# Patient Record
Sex: Male | Born: 2005 | Race: Black or African American | Hispanic: No | Marital: Single | Smoking: Current some day smoker
Health system: Southern US, Community
[De-identification: ages and names within clinical notes are randomized; demographics above are authoritative.]

## PROBLEM LIST (undated history)

## (undated) DIAGNOSIS — J45909 Unspecified asthma, uncomplicated: Secondary | ICD-10-CM

---
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
## 2023-11-11 NOTE — BH Treatment Plan (Signed)
 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  risperiDONE, 2 mg, oral, At Bedtime   Family Meeting:  collateral  Precautions: safety precautions    Treatment Plan:  medication management  Current Discharge Plan:  in progress  Expected Discharge Date:  TBD  Reason(s) patient continues to need hospitalization:  Medication Stabilization  The patient and/or caregiver continues to be involved in their plan of care and there is mutual agreement on the self-management plan.  The patient specific goals and risk factors from above were discussed with the patient and /or caregiver from a multidisciplinary approach.  All team members listed as present above agree with and sign off on the patient specific goals.        [1] Patient Active Problem List Diagnosis  . Elevated CK  . Mania    (CMD)  . Elevated LFTs  . Unspecified bipolar and related disorder  [2] Past Medical History: Diagnosis Date  . HL (hearing loss)

## 2023-11-12 NOTE — Progress Notes (Signed)
 Patient participated in psychoeducational process where we talked about the use/abuse/addiction process of addiction. We also talked about the neurobiological processes involved in Substance Use Disorder. We also talked about the connection between psychiatric and substance use disorders, and the importance of seeking treatment for both in order to develop healthy ways of living: spiritual, mental, social, emotional, and physical. Patients were encouraged to talk with their treatment team as a way of seeking additional assistance upon discharge related to any possible SUD issues.

## 2023-11-12 NOTE — Nursing Note (Signed)
 AVS reviewed with patient.  Verbalized understanding.  Encouraged to keep all follow-up appointments.  Medications were called into [facility] in [place] per sister's request.   Acknowledged receiving all belongings brought to the hospital.  Escorted to the front of [facility] where his friend Zyon was waiting.

## 2023-11-12 NOTE — Progress Notes (Signed)
 Case Management Discharge Note        CSN: [id number] DOB: 2005-02-15 Service: Psychiatry Location: S110/A  Patient Class: Inpatient Psych  DC Disposition: : Home or Self Care  Discharge DC Disposition: : Home or Self Care Discharge Transport Agency Chosen: Other (comment) (friend)  Discharge Referrals Services Provided: Social Worker Case closed, patient/family agree with disposition plan: Yes  Patient picked up by friend and discharged back to the apartment he shares with his sister. F/U was scheduled.  Transition record faxed.      Case Management Coordination Status: Coordination Complete     Harlene People, LCSWA

## 2023-11-25 ENCOUNTER — Emergency Department (HOSPITAL_COMMUNITY)
Admission: EM | Admit: 2023-11-25 | Discharge: 2023-11-26 | Disposition: A | Discharge status: Other Acute Inpatient Hospital | Attending: Emergency Medicine | Admitting: Emergency Medicine

## 2023-11-25 ENCOUNTER — Other Ambulatory Visit: Payer: Self-pay

## 2023-11-25 ENCOUNTER — Encounter (HOSPITAL_COMMUNITY): Payer: Self-pay

## 2023-11-25 DIAGNOSIS — J45909 Unspecified asthma, uncomplicated: Secondary | ICD-10-CM | POA: Insufficient documentation

## 2023-11-25 DIAGNOSIS — F1994 Other psychoactive substance use, unspecified with psychoactive substance-induced mood disorder: Secondary | ICD-10-CM

## 2023-11-25 DIAGNOSIS — F19959 Other psychoactive substance use, unspecified with psychoactive substance-induced psychotic disorder, unspecified: Secondary | ICD-10-CM

## 2023-11-25 DIAGNOSIS — F17290 Nicotine dependence, other tobacco product, uncomplicated: Secondary | ICD-10-CM | POA: Insufficient documentation

## 2023-11-25 DIAGNOSIS — F29 Unspecified psychosis not due to a substance or known physiological condition: Secondary | ICD-10-CM | POA: Diagnosis not present

## 2023-11-25 LAB — CBC WITH DIFFERENTIAL/PLATELET
Abs Immature Granulocytes: 0.01 K/uL (ref 0.00–0.07)
Basophils Absolute: 0 K/uL (ref 0.0–0.1)
Basophils Relative: 1 %
Eosinophils Absolute: 0.2 K/uL (ref 0.0–0.5)
Eosinophils Relative: 4 %
HCT: 50.1 % (ref 39.0–52.0)
Hemoglobin: 16.4 g/dL (ref 13.0–17.0)
Immature Granulocytes: 0 %
Lymphocytes Relative: 49 %
Lymphs Abs: 2.1 K/uL (ref 0.7–4.0)
MCH: 30.9 pg (ref 26.0–34.0)
MCHC: 32.7 g/dL (ref 30.0–36.0)
MCV: 94.4 fL (ref 80.0–100.0)
Monocytes Absolute: 0.5 K/uL (ref 0.1–1.0)
Monocytes Relative: 11 %
Neutro Abs: 1.5 K/uL — ABNORMAL LOW (ref 1.7–7.7)
Neutrophils Relative %: 35 %
Platelets: 228 K/uL (ref 150–400)
RBC: 5.31 MIL/uL (ref 4.22–5.81)
RDW: 11.7 % (ref 11.5–15.5)
WBC: 4.3 K/uL (ref 4.0–10.5)
nRBC: 0 % (ref 0.0–0.2)

## 2023-11-25 LAB — URINE DRUG SCREEN
Amphetamines: NEGATIVE
Barbiturates: NEGATIVE
Benzodiazepines: NEGATIVE
Cocaine: NEGATIVE
Fentanyl: NEGATIVE
Methadone Scn, Ur: NEGATIVE
Opiates: NEGATIVE
Tetrahydrocannabinol: POSITIVE — AB

## 2023-11-25 LAB — COMPREHENSIVE METABOLIC PANEL WITH GFR
ALT: 30 U/L (ref 0–44)
AST: 50 U/L — ABNORMAL HIGH (ref 15–41)
Albumin: 4.8 g/dL (ref 3.5–5.0)
Alkaline Phosphatase: 66 U/L (ref 38–126)
Anion gap: 11 (ref 5–15)
BUN: 14 mg/dL (ref 6–20)
CO2: 25 mmol/L (ref 22–32)
Calcium: 10 mg/dL (ref 8.9–10.3)
Chloride: 103 mmol/L (ref 98–111)
Creatinine, Ser: 1.08 mg/dL (ref 0.61–1.24)
GFR, Estimated: >60 mL/min (ref >60)
Glucose, Bld: 93 mg/dL (ref 70–99)
Potassium: 4.2 mmol/L (ref 3.5–5.1)
Sodium: 139 mmol/L (ref 135–145)
Total Bilirubin: 1.2 mg/dL (ref 0.0–1.2)
Total Protein: 8.2 g/dL — ABNORMAL HIGH (ref 6.5–8.1)

## 2023-11-25 MED ORDER — MELATONIN 3 MG PO TABS: 6.0000 mg | ORAL_TABLET | Freq: Every evening | ORAL | Status: DC | PRN

## 2023-11-25 MED ORDER — HYDROXYZINE HCL 25 MG PO TABS: 25.0000 mg | ORAL_TABLET | Freq: Three times a day (TID) | ORAL | Status: DC | PRN

## 2023-11-25 MED ORDER — RISPERIDONE 0.5 MG PO TBDP
2.0000 mg | ORAL_TABLET | Freq: Every day | ORAL | Status: DC
  Administered 2023-11-25: 2 mg via ORAL
  Filled 2023-11-25: qty 4

## 2023-11-25 MED ORDER — ALUM & MAG HYDROXIDE-SIMETH 200-200-20 MG/5ML PO SUSP: 30.0000 mL | Freq: Four times a day (QID) | ORAL | Status: DC | PRN

## 2023-11-25 MED ORDER — ACETAMINOPHEN 325 MG PO TABS: 650.0000 mg | ORAL_TABLET | ORAL | Status: DC | PRN

## 2023-11-25 MED ORDER — OLANZAPINE 5 MG PO TBDP
5.0000 mg | ORAL_TABLET | Freq: Once | ORAL | Status: DC
  Filled 2023-11-25: qty 1

## 2023-11-25 NOTE — ED Notes (Addendum)
 Pt has 2 belonging bags, placed in locker 28

## 2023-11-25 NOTE — Consult Note (Signed)
 [facility] Psychiatric Consult Initial  Patient Name: .Joe Moore  MRN: [id number]  DOB: 2005/06/27  Consult Order details:  Orders (From admission, onward)     Start     Ordered   11/25/23 1251  CONSULT TO CALL ACT TEAM       Ordering Provider: Elnor Jayson LABOR, DO  Provider:  (Not yet assigned)  Question:  Reason for Consult?  Answer:  Psych consult   11/25/23 1250             Mode of Visit: In person    Psychiatry Consult Evaluation  Service Date: November 25, 2023 LOS:  LOS: 0 days  Chief Complaint "They brought me here because of my mother. 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 male admitted: Presented to the EDfor 11/25/2023 10:31 AM for Bizarre behavior, paranoia, substance use; IVC initiated. He carries the psychiatric diagnoses of possible bipolar and has a past medical history of periorbital cellulitis, acute rhabdo myelosis, sinusitis.   18 year old male with recent substance use (mushrooms/THC), prior psychiatric hospitalization, medication noncompliance, trauma history, and strong family history of psychiatric disorders presents with paranoia, bizarre behavior, impaired judgment, and poor insight. Although minimizing symptoms, collateral confirms psychosis-like episodes, erratic behavior, safety risk, and lack of insight with repeated decompensation.  Patient's statement that "being here makes me want to hurt myself," combined with recent wandering behavior, disorganized thinking, and substance-related psychosis risk, meets criteria for continued inpatient psychiatric treatment. He is not psychiatrically safe for discharge. Please see plan below for detailed recommendations.   Diagnoses:  Active Hospital problems: Principal Problem:   Substance induced mood disorder (HCC) Active  Problems:   Psychosis (HCC)    Plan   ## Psychiatric Medication Recommendations:  Restart patient back on Risperdal 2 mg at bedtime for mood Melatonin 6 mg as needed at bedtime for insomnia Atarax 25 mg p.o. 3 times daily as needed for anxiety  ## Medical Decision Making Capacity: Not specifically addressed in this encounter  ## Further Work-up:  -- No further workup needed at this time EKG or UDS -- most recent EKG on 11/25/2023 had QtC of 388 -- Pertinent labwork reviewed earlier this admission includes: CBC, CMP, EKG, UDS   ## Disposition:-- We recommend inpatient psychiatric hospitalization after medical hospitalization. Patient has been involuntarily committed on 11/25/2023.   ## Behavioral / Environmental: -Difficult Patient (SELECT OPTIONS FROM BELOW), To minimize splitting of staff, assign one staff person to communicate all information from the team when feasible., or Utilize compassion and acknowledge the patient's experiences while setting clear and realistic expectations for care.    ## Safety and Observation Level:  - Based on my clinical evaluation, I estimate the patient to be at moderate risk of self harm in the current setting. - At this time, we recommend  1:1 Observation. This decision is based on my review of the chart including patient's history and current presentation, interview of the patient, mental status examination, and consideration of suicide risk including evaluating suicidal ideation, plan, intent, suicidal or self-harm behaviors, risk factors, and protective factors. This judgment is based on our ability to directly address suicide risk, implement suicide prevention strategies, and develop a safety plan while the patient is in the clinical setting. Please contact our team if there is a concern that risk level has changed.  CSSR Risk Category:C-SSRS RISK CATEGORY:  No Risk  Suicide Risk Assessment: Patient has following modifiable risk factors for suicide:  medication noncompliance and active mental illness (to encompass adhd, tbi, mania, psychosis, trauma reaction), which we are addressing by recommending inpatient psychiatric admission. Patient has following non-modifiable or demographic risk factors for suicide: male gender and psychiatric hospitalization Patient has the following protective factors against suicide: Access to outpatient mental health care and Supportive family  Thank you for this consult request. Recommendations have been communicated to the primary team.  We will continue to follow patient at this time.   [name], PMHNP       History of Present Illness  Relevant Aspects of Hospital ED Course:  Admitted on 11/25/2023 for Bizarre behavior, paranoia, substance use; IVC initiated.  Patient Report:  18 year old male with a history of polysubstance use and asthma presents under IVC after family reported concerns for paranoia, worsening depression, and bizarre behavior. Patient was brought in by police after being found in a different city, wandering in the street, and observed in fetal position on the floor of a gas station. Family reported suspected THC and hallucinogenic mushroom use.  During evaluation, patient is guarded, minimizing symptoms, and attempts to [facility] the provider using psychology terminology. 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 He denies SI, HI, hallucinations, or paranoia, though states "being here makes me want to hurt myself." He reports poor relationship with his mother but becomes evasive when asked to elaborate. He denies violence but states his mother was "physically collecting him," then refuses to expand on this. Denies current substance use except marijuana and claims mushrooms entered his system by only "touching a mushroom on campus." Denies psychotropic medications, despite recent documented use of risperidone.  Patient  initially denies paranoia but later states he thinks his mother is trying to kill him and "I'm just a 18 year old college student."  He denies current medical concerns.  Psych ROS:  Depression: Denies Anxiety: Endorses due to being here in the hospital Mania (lifetime and current): Denies Psychosis: (lifetime and current): Denies  Collateral information:  Collateral Information (Sister: Joe Moore; Mother present via 3-way call) Collateral reports significant decline and recurrent episodes of psychosis: Family confirms THC and mushroom use and found a bag of mushrooms in his room. Behavior became bizarre on Sunday: crying to professors at school, seeking $200 for a credit card bill, left home abruptly. Monday: took excessive showers, flooded sister's floors, began selective mutism, then drove away without notice. Family notes a pattern: during episodes he spirals, becomes paranoid, and sees his mother as the "enemy." Prior psychiatric hospitalization: [facility] 11/07/23: Initially hospitalized for rhabdomyolysis 10/20-10/23/25: Transferred to [facility]; discharged early after "talking his way out" per family Started on risperidone 2 mg, noncompliant since discharge Currently not sleeping or eating well  Significant trauma history: Brother killed in 2018 Maternal grandparents killed shortly after Never received grief counseling Prior depressive episode in 2024: isolated for 2 weeks in college dorm Attends therapy inconsistently Joe Moore, Joe Moore)  Family psychiatric history: Father - substance addiction Maternal great grandmother - schizophrenia Sister - bipolar disorder  Mother states family is supportive and he has stable housing, finances, and resources  Family strongly concerned for his safety and mental stability, noting significant behavioral decompensation with substance use.  Review of Systems  Psychiatric/Behavioral:         Thought  Process: Circumstantial, evasive, deflective  Thought Content: Minimizes symptoms; paranoid thoughts toward  mother; poor insight into recent behavior; denies SI/HI/AVH  Insight: Poor  Judgment: Impaired-recent disorganized behavior, substance use, inconsistent reporting     Psychiatric and Social History  Psychiatric History:  Information collected from patient, chart review, mother and sister  Prev Dx/Sx: Unspecified bipolar, substance-induced mood disorder Current Psych Provider: Denies Home Meds (current): Yes Previous Med Trials: Yes Therapy: Yes  Prior Psych Hospitalization: Yes Prior Self Harm: Denies Prior Violence: Denies  Family Psych History: Yes Family Hx suicide: Denies  Social History:  Developmental Hx: Deferred Educational Hx: Patient graduated high school Occupational Hx: Unemployed Armed Forces Operational Officer Hx: Denies Living Situation: Lives with his sister Spiritual Hx: Yes Access to weapons/lethal means: Denies   Substance History Alcohol: Yes Type of alcohol varies Last Drink unknown Number of drinks per day occasionally History of alcohol withdrawal seizures Denies History of DT's Denies Tobacco: Yes Illicit drugs: Yes Prescription drug abuse: Denies Rehab hx: Denies  Exam Findings  Physical Exam:  Vital Signs:  Temp:  [98.1 F (36.7 C)-98.3 F (36.8 C)] 98.1 F (36.7 C) (11/04 1501) Pulse Rate:  [65-70] 65 (11/04 1501) Resp:  [16-18] 16 (11/04 1501) BP: (109-122)/(82-85) 122/85 (11/04 1501) SpO2:  [100 %] 100 % (11/04 1501) Weight:  [81.6 kg] 81.6 kg (11/04 1034) Blood pressure 122/85, pulse 65, temperature 98.1 F (36.7 C), temperature source Oral, resp. rate 16, height 5' 11 (1.803 m), weight 81.6 kg, SpO2 100%. Body mass index is 25.1 kg/m.  Physical Exam Vitals and nursing note reviewed. Exam conducted with a chaperone present.  Constitutional:      Appearance: Normal appearance.  Neurological:     Mental Status: He is alert.  Psychiatric:         Attention and Perception: Attention normal.        Mood and Affect: Affect is flat.        Speech: Speech normal.        Behavior: Behavior is withdrawn.        Cognition and Memory: Memory normal.        Judgment: Judgment is inappropriate.     Comments: Thought Process: Circumstantial, evasive, deflective  Thought Content: Minimizes symptoms; paranoid thoughts toward mother; poor insight into recent behavior; denies SI/HI/AVH  Insight: Poor  Judgment: Impaired-recent disorganized behavior, substance use, inconsistent reporting     Mental Status Exam: General Appearance: Fairly Groomed  Orientation:  Full (Time, Place, and Person)  Memory:  Immediate;   Fair Recent;   Fair  Concentration:  Concentration: Fair  Recall:  Fair  Attention  Fair  Eye Contact:  Fair  Speech:  Clear and Coherent  Language:  Good  Volume:  Normal  Mood: "Anxious because I don't want to be here"  Affect:  Restricted, irritable  Thought Process:  Circumstantial, evasive, deflective  Thought Content:  Minimizes symptoms; paranoid thoughts toward mother; poor insight into recent behavior; denies SI/HI/AVH  Suicidal Thoughts:  No  Homicidal Thoughts:  No  Judgement:  Impaired--recent disorganized behavior, substance use, inconsistent reporting  Insight:  Shallow  Psychomotor Activity:  Normal  Akathisia:  No  Fund of Knowledge:  Fair      Assets:  Manufacturing Systems Engineer Desire for Improvement Financial Resources/Insurance Housing Social Support  Cognition:  Impaired,  Mild  ADL's:  Impaired  AIMS (if indicated):        Other History   These have been pulled in through the EMR, reviewed, and updated if appropriate.  Family History:  The patient's family history is not on  file.  Medical History: Past Medical History:  Diagnosis Date  . Asthma     Surgical History: History reviewed. No pertinent surgical history.   Medications:   Current Facility-Administered Medications:  .   acetaminophen  (TYLENOL ) tablet 650 mg, 650 mg, Oral, Q4H PRN, Elnor Savant A, DO .  alum & mag hydroxide-simeth (MAALOX/MYLANTA) 200-200-20 MG/5ML suspension 30 mL, 30 mL, Oral, Q6H PRN, Elnor Savant A, DO .  OLANZapine zydis (ZYPREXA) disintegrating tablet 5 mg, 5 mg, Oral, Once, Elnor Savant LABOR, DO  Current Outpatient Medications:  .  ibuprofen  (ADVIL ,MOTRIN ) 600 MG tablet, Take 1 tablet (600 mg total) by mouth every 8 (eight) hours as needed for mild pain or moderate pain., Disp: 30 tablet, Rfl: 0  Allergies: Allergies  Allergen Reactions  . Penicillins Other (See Comments), Nausea Only and Dermatitis    Other Reaction: mild rash on day 4  Other Reaction: mild rash on day 4  Other Reaction: mild rash on day 4    Joe Moore, PMHNP

## 2023-11-25 NOTE — ED Triage Notes (Signed)
 Pt to er via police, per pd pt was just released from [facility] for a week.  States that he thinks that his mom is trying to kill him.  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  Pd  states that mom is taking papers out for strange behavior.

## 2023-11-25 NOTE — Progress Notes (Signed)
 Inpatient Psychiatric Referral  Patient was recommended inpatient per Jadeka Motley-Mangrum, NP . There are no available beds at [facility], per [facility]. Patient was referred to the following out of network facilities:  Destination  Service Provider Address Phone Fax  [facility]  [address] [place] [address] [phone]  [facility]  [address], [place] [address] [phone]  [facility]  [address], [place] [address] [phone]  [facility] EFAX  [address] [place], [place] [phone]  [facility]  [address] [place], [place] [address] [phone]  [facility]  [address] [place], [place] [address] [phone]  [facility]  [address] [place] [address] [phone]  [facility]  [address] [place] [address] [phone]     Situation ongoing, CSW to continue following and update chart as more information becomes available.  Harrie Sofia MSW, LCSWA 11/25/2023

## 2023-11-25 NOTE — ED Notes (Signed)
 Pt verbally told me he does not want staff to GIVE OUT ANY HEALTH INFORMATION to his MOTHER.

## 2023-11-25 NOTE — ED Provider Notes (Signed)
 [place] EMERGENCY DEPARTMENT AT [facility] Provider Note  CSN: [id number] Arrival date & time: 11/25/23 1027  Chief Complaint(s) Psychiatric Evaluation  HPI Joe Moore is a 18 y.o. male with past medical history as below, significant for asthma, polysubstance abuse who presents to the ED with complaint of IVC  Patient arrived to ER with police department.  Concern for paranoia, worsening depression, bizarre behavior.  Family reported patient was using THC and hallucinogenic mushrooms.  Patient drove himself to a different city and was wandering in the street. In the fetal position on the floor of a gas station. Patient denies HI or SI, denies hallucinations. No medical complaints verbalized   Past Medical History Past Medical History:  Diagnosis Date   Asthma    There are no active problems to display for this patient.  Home Medication(s) Prior to Admission medications   Medication Sig Start Date End Date Taking? Authorizing Provider  ibuprofen  (ADVIL ,MOTRIN ) 600 MG tablet Take 1 tablet (600 mg total) by mouth every 8 (eight) hours as needed for mild pain or moderate pain. 06/06/16   Joe Mariel Boon, NP                                                                                                                                    Past Surgical History History reviewed. No pertinent surgical history. Family History History reviewed. No pertinent family history.  Social History Social History   Tobacco Use   Smoking status: Some Days    Types: Cigars   Smokeless tobacco: Never  Vaping Use   Vaping status: Never Used  Substance Use Topics   Alcohol use: Never   Drug use: Yes    Types: Marijuana   Allergies Penicillins  Review of Systems A thorough review of systems was obtained and all systems are negative except as noted in the HPI and PMH.   Physical Exam Vital Signs  I have reviewed the triage vital signs BP 109/82 (BP Location:  Left Arm)   Pulse 70   Temp 98.3 F (36.8 C) (Oral)   Resp 18   Ht 5' 11 (1.803 m)   Wt 81.6 kg   SpO2 100%   BMI 25.10 kg/m  Physical Exam Vitals and nursing note reviewed.  Constitutional:      General: He is not in acute distress.    Appearance: Normal appearance. He is well-developed. He is not ill-appearing.  HENT:     Head: Normocephalic and atraumatic.     Right Ear: External ear normal.     Left Ear: External ear normal.     Nose: Nose normal.     Mouth/Throat:     Mouth: Mucous membranes are moist.  Eyes:     General: No scleral icterus.       Right eye: No discharge.        Left eye: No discharge.  Cardiovascular:  Rate and Rhythm: Normal rate.  Pulmonary:     Effort: Pulmonary effort is normal. No respiratory distress.     Breath sounds: No stridor.  Abdominal:     General: Abdomen is flat. There is no distension.     Tenderness: There is no guarding.  Musculoskeletal:        General: No deformity.     Cervical back: No rigidity.  Skin:    General: Skin is warm and dry.     Coloration: Skin is not cyanotic, jaundiced or pale.  Neurological:     Mental Status: He is alert.  Psychiatric:        Mood and Affect: Mood is depressed.        Speech: Speech is rapid and pressured.        Behavior: Behavior is withdrawn and hyperactive. Behavior is cooperative.        Thought Content: Thought content is paranoid and delusional.     ED Results and Treatments Labs (all labs ordered are listed, but only abnormal results are displayed) Labs Reviewed  COMPREHENSIVE METABOLIC PANEL WITH GFR - Abnormal; Notable for the following components:      Result Value   Total Protein 8.2 (*)    AST 50 (*)    All other components within normal limits  CBC WITH DIFFERENTIAL/PLATELET - Abnormal; Notable for the following components:   Neutro Abs 1.5 (*)    All other components within normal limits  ETHANOL  URINE DRUG SCREEN                                                                                                                           Radiology No results found.  Pertinent labs & imaging results that were available during my care of the patient were reviewed by me and considered in my medical decision making (see MDM for details).  Medications Ordered in ED Medications  OLANZapine zydis (ZYPREXA) disintegrating tablet 5 mg (5 mg Oral Patient Refused/Not Given 11/25/23 1202)  acetaminophen  (TYLENOL ) tablet 650 mg (has no administration in time range)  alum & mag hydroxide-simeth (MAALOX/MYLANTA) 200-200-20 MG/5ML suspension 30 mL (has no administration in time range)                                                                                                                                     Procedures Procedures  (  including critical care time)  Medical Decision Making / ED Course    Medical Decision Making:    Joe Moore is a 18 y.o. male  with past medical history as below, significant for asthma, polysubstance abuse who presents to the ED with complaint of IVC. The complaint involves an extensive differential diagnosis and also carries with it a high risk of complications and morbidity.  Serious etiology was considered. Ddx includes but is not limited to: Substance induced psychiatric disorder, psychosis, intoxication, electrolyte derangement, infection, etc.  Complete initial physical exam performed, notably the patient was in acute distress, tearful.    Reviewed and confirmed nursing documentation for past medical history, family history, social history.  Vital signs reviewed.    Acute psychosis> - pt acutely psychotic - High suspicion for drug-induced psychosis patient has been using THC and hallucinogenic mushrooms.  Patient was found in the gas station in a different city, concern for paranoia that family members are trying to harm him.  He really refusing to speak - Patient has poor insight into his condition, has not  been caring for himself over the past few days, abusing illicit substances.  High risk of decompensation and self-harm.  Will place patient under IVC - Medically cleared at this time, pending TTS evaluation. Dispo per TTS - he was admitted at [facility] last month for similar                     Additional history obtained: -Additional history obtained from PD -External records from outside source obtained and reviewed including: Chart review including previous notes, labs, imaging, consultation notes including  Prior admission      Lab Tests: -I ordered, reviewed, and interpreted labs.   The pertinent results include:   Labs Reviewed  COMPREHENSIVE METABOLIC PANEL WITH GFR - Abnormal; Notable for the following components:      Result Value   Total Protein 8.2 (*)    AST 50 (*)    All other components within normal limits  CBC WITH DIFFERENTIAL/PLATELET - Abnormal; Notable for the following components:   Neutro Abs 1.5 (*)    All other components within normal limits  ETHANOL  URINE DRUG SCREEN    Notable for labs stable  EKG   EKG Interpretation Date/Time:  Tuesday November 25 2023 11:09:17 EST Ventricular Rate:  65 PR Interval:  176 QRS Duration:  88 QT Interval:  373 QTC Calculation: 388 R Axis:   64  Text Interpretation: Sinus rhythm Confirmed by Randol Simmonds [phone]) on 11/25/2023 11:15:41 AM         Imaging Studies ordered: na   Medicines ordered and prescription drug management: Meds ordered this encounter  Medications   OLANZapine zydis (ZYPREXA) disintegrating tablet 5 mg   acetaminophen  (TYLENOL ) tablet 650 mg   alum & mag hydroxide-simeth (MAALOX/MYLANTA) 200-200-20 MG/5ML suspension 30 mL    -I have reviewed the patients home medicines and have made adjustments as needed   Consultations Obtained: I requested consultation with the TTS   Cardiac Monitoring: Continuous pulse oximetry interpreted by myself, 100% on RA.    Social  Determinants of Health:  Diagnosis or treatment significantly limited by social determinants of health: current smoker and polysubstance abuse, uninsured   Reevaluation: After the interventions noted above, I reevaluated the patient and found that they have stayed the same  Co morbidities that complicate the patient evaluation  Past Medical History:  Diagnosis Date   Asthma  Dispostion: Disposition decision including need for hospitalization was considered, and patient disposition pending at time of sign out.    Final Clinical Impression(s) / ED Diagnoses Final diagnoses:  Psychosis, unspecified psychosis type (HCC)        Elnor Jayson LABOR, DO 11/25/23 1301

## 2023-11-25 NOTE — Progress Notes (Signed)
 Pt has been accepted to [facility] . Unit assignment: [facility]   Pt meets inpatient criteria per [name], NP   Attending Physician will be Dr. Arnold  Report can be called to: [phone]  Pt can arrive after ASAP 8AM  Care Team Notified: Ester Sloop, RN,  [name], NP

## 2023-11-25 NOTE — ED Notes (Signed)
 Pt has been calm and cooperative with staff, no aggressive behavior noted.

## 2023-11-25 NOTE — ED Notes (Addendum)
 Pt purposely broke one of his wristbands off to take them off upon staff request. Security officer witnessed, placed into locker 28

## 2023-11-26 NOTE — ED Notes (Signed)
  Pt has been accepted to [facility] . Unit assignment: [facility]    Pt meets inpatient criteria per [name], NP    Attending Physician will be Dr. Arnold   Report can be called to: [phone]   Pt can arrive after ASAP 8AM   Care Team Notified: Ester Sloop, RN,  [name], NP         Electronically signed by Fate Mix, LCSW at 11/25/2023  7:10 PM

## 2023-11-26 NOTE — Discharge Instructions (Addendum)
 Transfer to old [facility].  Dr.Iqbal is accepting

## 2023-11-26 NOTE — ED Notes (Signed)
 Patient discharged off the unit to facility per provider. Patient alert, calm, cooperative, no s/s of distress at discharge. Discharge information and belongings given to [facility] for transport. Patient ambulatory off unit, escorted and transported by [facility].

## 2023-11-26 NOTE — ED Notes (Addendum)
 [facility] called pts sister, [place] to inform her that pt has been accepted to [facility] and will be transferred there today. [facility] left a HIPAA compliant message to return the call.   Pts sister returned the call to [facility]. [facility] informed pts sister that pt will be transferred today to [facility] provided contact information for [facility]. Pts sister was appreciative of the information.   Chesley Holt, [facility]   11/26/23

## 2023-12-05 NOTE — Progress Notes (Signed)
 [address] - AMBULATORY [facility]  - FAMILY MEDICINE [facility] [address] [place] [address]    Date of Service: 12/05/2023 Patient Name: Joe Moore Patient DOB: [birth year]    Assessment and Plan:  Joe Moore was seen today for hand injury.  Diagnoses and all orders for this visit:  Right hand pain Recommend him getting x-ray of his hand today and if positive will send to orthopedics otherwise still recommend him wearing a brace until knows the plan. -     XR Hand Minimum 3 Views Right; Future    He verbalizes understanding and agreement of his current diagnoses, medications and therapies. All questions answered.   Medication side effects discussed with patient. Advised patient to call clinic or return for visit if these symptoms occur. The patient does not  have any barriers to taking medications as prescribed. Goals of care discussed with patient including medication adherence and adequate follow up Relevant barriers identified and addressed: none.   Results for orders placed or performed during the hospital encounter of 11/10/23  Hemoglobin A1C With Estimated Average Glucose   Collection Time: 11/11/23  7:11 AM  Result Value Ref Range   Hemoglobin A1c 5.3 <5.7 %   Estimated Average Glucose 105 mg/dL   Return if symptoms worsen or fail to improve. Electronically signed by: Elsie Fairy Favorite, PA-C 12/05/2023 10:54 AM   HPI:   Joe Moore is a 18 y.o. male presents with Hand Injury (Pt punched the wall yesterday,  he was seen at [facility] and a nurse told him he had a fracture.  He was supposed to get an xray last night but did not  )    ROS:  Constitutional symptoms: negative Eyes:  negative Ear, nose, throat:  negative Cardiovascular:  negative Respiratory:  negative Gastrointestinal:  negative Genitourinary:  negative Skin:  negative Neurological:  negative Musculoskeletal:   right hand injury Psychiatric:  negative Endocrine:  negative Hematological:  negative Allergic:  negative  Objective:   Vitals:   12/05/23 1023  BP: 127/80  Pulse: 74  Resp: 14  Temp: 98.3 F (36.8 C)  TempSrc: Temporal  SpO2: 100%  Weight: 86.3 kg (190 lb 4.8 oz)  Height: 1.778 m (5' 10)    Constitutional: Well-developed and well-nourished. Sitting comfortably conversing normally. Pleasant.  Cardiovascular: +S1S2, regular rate and rhythm, no murmurs, gallops or rubs appreciated.  Respiratory: Normal effort, clear to auscultation bilaterally. No wheezes, rales or rhonchi noted.  Extremities: Mildly tender midshaft right fifth metacarpal patient can make a full fist.  Knuckles are present no swelling today.  No obvious deformity noted.   Allergies Penicillin  Current Medications[1] Problem List[2] Surgical History[3] Family History[4] Social History[5] Tobacco Use History[6]   I have reviewed and (if needed) updated the patient's problem list, medications, allergies, past medical and surgical history, social and family history. Health Maintenance Status       Date Due Completion Dates   Comprehensive Annual Visit 08/13/2023 08/13/2022, 08/13/2022 (Done)   Influenza Vaccine (1) 08/22/2023 12/07/2012, 12/20/2009   COVID-19 Vaccine (4 - 2025-26 season) 09/22/2023 08/04/2020, 11/16/2019   Depression Monitoring 05/07/2024 11/07/2023   DTaP/Tdap/Td Vaccines (7 - Td or Tdap) 08/15/2024 08/16/2014, 11/09/2007   Adult RSV (50+ Years or Pregnancy) (1 - 1-dose 75+ series) 04/04/2078 ---       This document serves as a record of services personally performed by Elsie Favorite PA-C,  It was created on  their behalf by Katheryn Gentry, RMA, a trained medical scribe, and Certified Medical Assistant (CMA). During the course of documenting the history, physical exam and medical decision making, I was functioning as a stage manager. The creation of this record is the provider's dictation and/or  activities during the visit.  Electronically signed by Katheryn Gentry, RMA 12/05/2023 10:23 AM     I, Elsie Favorite, PA-C, have reviewed the scribe's documentation, personally examined the patient, added my documentation and attest it is accurate and complete.       [1] Current Outpatient Medications  Medication Sig Dispense Refill  . risperiDONE (RisperDAL) 2 mg tab tablet Take 1 tablet (2 mg total) by mouth at bedtime. 30 tablet 0  . lanolin alcohol-mo-w.pet-ceres (EUCERIN) cream Apply topically as needed for dry skin. (Patient not taking: Reported on 12/05/2023)    . melatonin 3 mg tablet Take 2 tablets (6 mg total) by mouth nightly as needed for sleep. Take two hours before intended bedtime. (Patient not taking: Reported on 12/05/2023) 30 tablet 0   No current facility-administered medications for this visit.  [2] Patient Active Problem List Diagnosis  . Elevated CK  . Mania    (CMD)  . Elevated LFTs  . 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 Cannabis use disorder, severe  [3] History reviewed. No pertinent surgical history. [4] Family History Problem Relation Name Age of Onset  . No Known Problems Mother    . No Known Problems Father    . Colon cancer Paternal Uncle    . Dementia Maternal Grandfather    [5] Social History Socioeconomic History  . Marital status: Single  Tobacco Use  . Smoking status: Never    Passive exposure: Never  . Smokeless tobacco: Never  Vaping Use  . Vaping status: Never Used  Substance and Sexual Activity  . Alcohol use: Never  . Drug use: Never  . Sexual activity: Yes    Partners: Female   Social Drivers of Health   Food Insecurity: Low Risk  (11/10/2023)   Food vital sign   . Within the past 12 months, you worried that your food would run out before you got money to buy more: Never true   . Within the past 12 months, the food you bought just didn't last and you didn't have money to get more: Never true   Transportation Needs: No Transportation Needs (11/10/2023)   Transportation   . In the past 12 months, has lack of reliable transportation kept you from medical appointments, meetings, work or from getting things needed for daily living? : No  Safety: Low Risk  (11/10/2023)   Safety   . How often does anyone, including family and friends, physically hurt you?: Never   . How often does anyone, including family and friends, insult or talk down to you?: Never   . How often does anyone, including family and friends, threaten you with harm?: Never   . How often does anyone, including family and friends, scream or curse at you?: Never  Living Situation: Low Risk  (11/10/2023)   Living Situation   . What is your living situation today?: I have a steady place to live   . Think about the place you live. Do you have problems with any of the following? Choose all that apply:: None/None on this list  [6] Social History Tobacco Use  Smoking Status Never  . Passive exposure: Never  Smokeless Tobacco Never

## 2024-01-17 ENCOUNTER — Emergency Department (HOSPITAL_COMMUNITY)

## 2024-01-17 ENCOUNTER — Emergency Department (HOSPITAL_COMMUNITY)
Admission: EM | Admit: 2024-01-17 | Discharge: 2024-01-17 | Disposition: A | Discharge status: Home / Self Care | Attending: Emergency Medicine | Admitting: Emergency Medicine

## 2024-01-17 DIAGNOSIS — M545 Low back pain, unspecified: Secondary | ICD-10-CM | POA: Diagnosis present

## 2024-01-17 LAB — URINALYSIS, ROUTINE W REFLEX MICROSCOPIC
Bilirubin Urine: NEGATIVE
Glucose, UA: NEGATIVE mg/dL
Hgb urine dipstick: NEGATIVE
Ketones, ur: NEGATIVE mg/dL
Leukocytes,Ua: NEGATIVE
Nitrite: NEGATIVE
Protein, ur: NEGATIVE mg/dL
Specific Gravity, Urine: 1.02 (ref 1.005–1.030)
pH: 7 (ref 5.0–8.0)

## 2024-01-17 MED ORDER — LIDOCAINE 5 % EX PTCH
1.0000 | MEDICATED_PATCH | CUTANEOUS | Status: DC
  Administered 2024-01-17: 1 via TRANSDERMAL
  Filled 2024-01-17: qty 1

## 2024-01-17 NOTE — ED Triage Notes (Addendum)
 Patient c/o lower back pain starting 5 days ago Patient says it started when he started taking abilify Pain rated 5/10  Patient says he is constipated also and the pain is in sacrum area .

## 2024-01-17 NOTE — ED Provider Notes (Signed)
 " [place] EMERGENCY DEPARTMENT AT [facility] Provider Note   CSN: [id number] Arrival date & time: 01/17/24  1531     Patient presents with: Back Pain   Joe Moore is a 18 y.o. male.  {Add pertinent medical, surgical, social history, OB history to HPI:32947}  Back Pain      Prior to Admission medications  Medication Sig Start Date End Date Taking? Authorizing Provider  ibuprofen  (ADVIL ,MOTRIN ) 600 MG tablet Take 1 tablet (600 mg total) by mouth every 8 (eight) hours as needed for mild pain or moderate pain. 06/06/16   Jakie Mariel Boon, NP    Allergies: Penicillins    Review of Systems  Musculoskeletal:  Positive for back pain.    Updated Vital Signs BP 136/84 (BP Location: Right Arm)   Pulse 66   Temp 98.4 F (36.9 C)   Resp 14   Ht 5' 11 (1.803 m)   Wt 83.9 kg   SpO2 100%   BMI 25.80 kg/m   Physical Exam  (all labs ordered are listed, but only abnormal results are displayed) Labs Reviewed  URINALYSIS, ROUTINE W REFLEX MICROSCOPIC - Abnormal; Notable for the following components:      Result Value   APPearance CLOUDY (*)    All other components within normal limits    EKG: None  Radiology: CT Renal Stone Study Result Date: 01/17/2024 EXAM: CT UROGRAM 01/17/2024 04:56:14 PM TECHNIQUE: CT of the abdomen and pelvis was performed without the administration of intravenous contrast as per CT urogram protocol. Multiplanar reformatted images as well as MIP urogram images are provided for review. Automated exposure control, iterative reconstruction, and/or weight based adjustment of the mA/kV was utilized to reduce the radiation dose to as low as reasonably achievable. COMPARISON: None available. CLINICAL HISTORY: Abdominal/flank pain, stone suspected. FINDINGS: LOWER CHEST: No acute abnormality. LIVER: The liver is unremarkable. GALLBLADDER AND BILE DUCTS: Gallbladder is unremarkable. No biliary ductal dilatation. SPLEEN: No acute  abnormality. PANCREAS: No acute abnormality. ADRENAL GLANDS: No acute abnormality. KIDNEYS, URETERS AND BLADDER: No stones in the kidneys or ureters. No hydronephrosis. No perinephric or periureteral stranding. Urinary bladder is unremarkable. GI AND BOWEL: Stomach demonstrates no acute abnormality. There is no bowel obstruction. APPENDIX: The appendix appears normal. PERITONEUM AND RETROPERITONEUM: No ascites. No free air. VASCULATURE: Aorta is normal in caliber. LYMPH NODES: No lymphadenopathy. REPRODUCTIVE ORGANS: No acute abnormality. BONES AND SOFT TISSUES: No acute osseous abnormality. No focal soft tissue abnormality. IMPRESSION: 1. No nephrolithiasis, obstructive uropathy, or urothelial mass. 2. Normal appendix. Electronically signed by: Greig Pique MD 01/17/2024 06:22 PM EST RP Workstation: [id number]    {Document cardiac monitor, telemetry assessment procedure when appropriate:32947} Procedures   Medications Ordered in the ED  lidocaine  (LIDODERM ) 5 % 1 patch (1 patch Transdermal Patch Applied 01/17/24 1747)      {Click here for ABCD2, HEART and other calculators REFRESH Note before signing:1}                              Medical Decision Making Amount and/or Complexity of Data Reviewed Labs: ordered. Radiology: ordered.  Risk Prescription drug management.   ***  {Document critical care time when appropriate  Document review of labs and clinical decision tools ie CHADS2VASC2, etc  Document your independent review of radiology images and any outside records  Document your discussion with family members, caretakers and with consultants  Document social determinants of health affecting pt's care  Document your decision making why or why not admission, treatments were needed:32947:::1}   Final diagnoses:  None    ED Discharge Orders     None        "

## 2024-01-17 NOTE — ED Provider Triage Note (Signed)
 Emergency Medicine Provider Triage Evaluation Note  Yadriel Kerrigan , a 18 y.o. male  was evaluated in triage.  Pt complains of intermittent left sided low back pain. Reports started after switching his medication to Abilify from risperdone. Started abilify 5 days ago. No blood in stool nor dark stools. No recent trauma. Has not tried analgesia at home. Reports concern for kidney stone as mother had similar symptoms  No urinary symptoms. No hx if malignancy nor IVDU. No fevers. No saddle paresthesia. No urinary incontinence.  Review of Systems  Positive: See hpi Negative:   Physical Exam  BP 136/84 (BP Location: Right Arm)   Pulse 66   Temp 98.4 F (36.9 C)   Resp 14   Ht 5' 11 (1.803 m)   Wt 83.9 kg   SpO2 100%   BMI 25.80 kg/m  Gen:   Awake, no distress   Resp:  Normal effort  MSK:   Moves extremities without difficulty  Other:    Medical Decision Making  Medically screening exam initiated at 4:23 PM.  Appropriate orders placed.  [name] Kipper was informed that the remainder of the evaluation will be completed by another provider, this initial triage assessment does not replace that evaluation, and the importance of remaining in the ED until their evaluation is complete.  UA, and renal study ordered   Minnie Tinnie BRAVO, PA 01/17/24 1630

## 2024-01-17 NOTE — Discharge Instructions (Signed)
 Thank you for letting us  evaluate you today.  Your CT study does not show any kidney stone or urinary tract infection.  There does not appear to be a significant stool burden.  Your urine does not show urinary tract infection.  Please make sure to drink plenty of water, high-fiber diet, stool softener  Return to Emergency Department you experience intractable vomiting causing be able to keep fluids down, inability to feel genital region, peeing all over yourself, worsening symptoms
